# Patient Record
Sex: Male | Born: 1990 | Race: White | Hispanic: No | Marital: Married | Smoking: Former smoker
Health system: Southern US, Community
[De-identification: ages and names within clinical notes are randomized; demographics above are authoritative.]

## PROBLEM LIST (undated history)

## (undated) DIAGNOSIS — T7840XA Allergy, unspecified, initial encounter: Secondary | ICD-10-CM

## (undated) DIAGNOSIS — F419 Anxiety disorder, unspecified: Secondary | ICD-10-CM

## (undated) DIAGNOSIS — J45909 Unspecified asthma, uncomplicated: Secondary | ICD-10-CM

## (undated) DIAGNOSIS — F32A Depression, unspecified: Secondary | ICD-10-CM

## (undated) DIAGNOSIS — F329 Major depressive disorder, single episode, unspecified: Secondary | ICD-10-CM

## (undated) HISTORY — DX: Anxiety disorder, unspecified: F41.9

## (undated) HISTORY — DX: Major depressive disorder, single episode, unspecified: F32.9

## (undated) HISTORY — DX: Depression, unspecified: F32.A

## (undated) HISTORY — DX: Unspecified asthma, uncomplicated: J45.909

## (undated) HISTORY — DX: Allergy, unspecified, initial encounter: T78.40XA

---
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
## 2010-12-10 NOTE — Op Note (Signed)
[facility]  Patient:    Michael Townsend, Michael Townsend                      MRN: [id number] Proc. Date: 01/24/00 Adm. Date:  [id number] Attending:  Devoria Albe CC:         Dario Guardian, M.D.                           Operative Report  PROCEDURE:  Left scrotal exploration and excision of torsed appendix testis.  PREOPERATIVE DIAGNOSIS:  Left acute scrotum.  POSTOPERATIVE DIAGNOSIS:  Left torsed appendix testis.  SURGEON:  Bjorn Pippin, M.D.  ANESTHESIA:  General.  COMPLICATIONS:  None.  SPECIMEN:  Torsed appendix testis.  INDICATION FOR PROCEDURE:  [name] is a 52-year-old white male with acute onset at midday today of left testicular pain. 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 After discussing the situation with the parents, we elected to proceed with scrotal exploration to rule out torsion of the testicle versus torsed appendix testis.  DESCRIPTION OF PROCEDURE:  The patient was taken to the operating room where a general anesthetic was induced. He was placed in the supine position. His genitalia was prepped with Betadine solution and he was draped in the usual sterile fashion. A left scrotal incision was made obliquely along the lines of the scrotal rugae. The testicle within the appendix was delivered from the wound. The tunica vaginalis was opened and the testicle was delivered. At the upper pole of the testicle, there was a small appendix testis which was somewhat edematous and erythematous consistent with an intermittently torsing appendix testis. After identifying this lesion, it was removed with the needle point Bovie. Inspection of the testicle revealed no other abnormalities. The testicle was returned to the tunica vaginalis which was then closed with a running 4-0 chromic. The dartos muscle was closed with a running 4-0 chromic and the skin was closed with interrupted 4-0 chromic. Collodion was applied  to the wound and a dressing of 4 x 4s and a scrotal support was applied. The patients anesthetic was reversed and he was admitted to the recovery room in stable condition. There were no complications. DD:  01/24/00 TD:  01/25/00 Job: [id number]

## 2015-04-12 ENCOUNTER — Ambulatory Visit (INDEPENDENT_AMBULATORY_CARE_PROVIDER_SITE_OTHER): Payer: Managed Care, Other (non HMO) | Admitting: Emergency Medicine

## 2015-04-12 VITALS — BP 128/70 | HR 95 | Temp 98.7°F | Resp 18 | Ht 67.5 in | Wt 245.0 lb

## 2015-04-12 DIAGNOSIS — J0140 Acute pansinusitis, unspecified: Secondary | ICD-10-CM

## 2015-04-12 DIAGNOSIS — J209 Acute bronchitis, unspecified: Secondary | ICD-10-CM | POA: Diagnosis not present

## 2015-04-12 MED ORDER — PSEUDOEPHEDRINE-GUAIFENESIN ER 60-600 MG PO TB12: 1.0000 | ORAL_TABLET | Freq: Two times a day (BID) | ORAL | Status: AC

## 2015-04-12 MED ORDER — HYDROCOD POLST-CPM POLST ER 10-8 MG/5ML PO SUER: 5.0000 mL | Freq: Two times a day (BID) | ORAL | Status: AC

## 2015-04-12 MED ORDER — AMOXICILLIN-POT CLAVULANATE 875-125 MG PO TABS: 1.0000 | ORAL_TABLET | Freq: Two times a day (BID) | ORAL | Status: AC

## 2015-04-12 NOTE — Progress Notes (Signed)
Subjective:  Patient ID: Michael Townsend, male    DOB: 1991-03-23  Age: 24 y.o. MRN: [id number]  CC: Cough; Sinus Problem; and Diarrhea   HPI Michael Townsend presents  with nasal congestion postnasal drainage nasal discharge all purulent character. She has a sore throat. Pressure in his cheeks. Denies any fever chills. No nausea or vomiting. Has a cough productive of purulent sputum and hasn't slept for 2 nights because of cough. Had no wheezing or shortness of breath. Has had some loose stool over the last 2 days which is improved.  History Michael Townsend has a past medical history of Allergy; Anxiety; Asthma; and Depression.   He has no past surgical history on file.   His  family history includes Diabetes in his maternal grandfather, maternal grandmother, paternal grandfather, and paternal grandmother; Heart disease in his maternal grandfather, maternal grandmother, paternal grandfather, and paternal grandmother; Hyperlipidemia in his father, maternal grandfather, maternal grandmother, paternal grandfather, and paternal grandmother.  He   reports that he has quit smoking. His smoking use included Cigarettes. He has a 2 pack-year smoking history. His smokeless tobacco use includes Snuff. He reports that he drinks about 1.8 oz of alcohol per week. He reports that he does not use illicit drugs.  No outpatient prescriptions prior to visit.   No facility-administered medications prior to visit.    Social History   Social History  . Marital Status: Married    Spouse Name: N/A  . Number of Children: N/A  . Years of Education: N/A   Social History Main Topics  . Smoking status: Former Smoker -- 1.00 packs/day for 2 years    Types: Cigarettes  . Smokeless tobacco: Current User    Types: Snuff     Comment: Quit in September 2016  . Alcohol Use: 1.8 oz/week    3 Standard drinks or equivalent per week  . 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 Sexual Activity: Not Asked   Other  Topics Concern  . None   Social History Narrative  . None     Review of Systems  Constitutional: Negative for fever, chills and appetite change.  HENT: Positive for congestion, postnasal drip, rhinorrhea, sinus pressure and sore throat. Negative for ear pain.   Eyes: Negative for pain and redness.  Respiratory: Positive for cough. Negative for shortness of breath and wheezing.   Cardiovascular: Negative for leg swelling.  Gastrointestinal: Negative for nausea, vomiting, abdominal pain, diarrhea, constipation and blood in stool.  Endocrine: Negative for polyuria.  Genitourinary: Negative for dysuria, urgency, frequency and flank pain.  Musculoskeletal: Negative for gait problem.  Skin: Negative for rash.  Neurological: Negative for weakness and headaches.  Psychiatric/Behavioral: Negative for confusion and decreased concentration. The patient is not nervous/anxious.     Objective:  BP 128/70 mmHg  Pulse 95  Temp(Src) 98.7 F (37.1 C) (Oral)  Resp 18  Ht 5' 7.5" (1.715 m)  Wt 245 lb (111.131 kg)  BMI 37.78 kg/m2  SpO2 98%  Physical Exam  Constitutional: He is oriented to person, place, and time. He appears well-developed and well-nourished. No distress.  HENT:  Head: Normocephalic and atraumatic.  Right Ear: External ear normal.  Left Ear: External ear normal.  Nose: Nose normal.  Eyes: Conjunctivae and EOM are normal. Pupils are equal, round, and reactive to light. No scleral icterus.  Neck: Normal range of motion. Neck supple. No tracheal deviation present.  Cardiovascular: Normal rate, regular rhythm and normal  heart sounds.   Pulmonary/Chest: Effort normal. No respiratory distress. He has no wheezes. He has no rales.  Abdominal: He exhibits no mass. There is no tenderness. There is no rebound and no guarding.  Musculoskeletal: He exhibits no edema.  Lymphadenopathy:    He has no cervical adenopathy.  Neurological: He is alert and oriented to person, place, and time.  Coordination normal.  Skin: Skin is warm and dry. No rash noted.  Psychiatric: He has a normal mood and affect. His behavior is normal.      Assessment & Plan:   Michael Townsend was seen today for cough, sinus problem and diarrhea.  Diagnoses and all orders for this visit:  Acute bronchitis, unspecified organism  Acute pansinusitis, recurrence not specified  Other orders -     amoxicillin-clavulanate (AUGMENTIN) 875-125 MG per tablet; Take 1 tablet by mouth 2 (two) times daily. -     pseudoephedrine-guaifenesin (MUCINEX D) 60-600 MG per tablet; Take 1 tablet by mouth every 12 (twelve) hours. -     chlorpheniramine-HYDROcodone (TUSSIONEX PENNKINETIC ER) 10-8 MG/5ML SUER; Take 5 mLs by mouth 2 (two) times daily.  I am having Michael Townsend start on amoxicillin-clavulanate, pseudoephedrine-guaifenesin, and chlorpheniramine-HYDROcodone.  Meds ordered this encounter  Medications  . amoxicillin-clavulanate (AUGMENTIN) 875-125 MG per tablet    Sig: Take 1 tablet by mouth 2 (two) times daily.    Dispense:  20 tablet    Refill:  0  . pseudoephedrine-guaifenesin (MUCINEX D) 60-600 MG per tablet    Sig: Take 1 tablet by mouth every 12 (twelve) hours.    Dispense:  18 tablet    Refill:  0  . chlorpheniramine-HYDROcodone (TUSSIONEX PENNKINETIC ER) 10-8 MG/5ML SUER    Sig: Take 5 mLs by mouth 2 (two) times daily.    Dispense:  60 mL    Refill:  0    Appropriate red flag conditions were discussed with the patient as well as actions that should be taken.  Patient expressed his understanding.  Follow-up: Return if symptoms worsen or fail to improve.  Carmelina Dane, MD

## 2015-04-12 NOTE — Patient Instructions (Signed)

## 2022-01-22 ENCOUNTER — Encounter (HOSPITAL_COMMUNITY): Payer: Self-pay | Admitting: Emergency Medicine

## 2022-01-22 ENCOUNTER — Other Ambulatory Visit: Payer: Self-pay

## 2022-01-22 ENCOUNTER — Emergency Department (HOSPITAL_COMMUNITY)
Admission: EM | Admit: 2022-01-22 | Discharge: 2022-01-22 | Disposition: A | Discharge status: Home / Self Care | Payer: Self-pay | Attending: Emergency Medicine | Admitting: Emergency Medicine

## 2022-01-22 DIAGNOSIS — L500 Allergic urticaria: Secondary | ICD-10-CM | POA: Insufficient documentation

## 2022-01-22 DIAGNOSIS — T7840XA Allergy, unspecified, initial encounter: Secondary | ICD-10-CM

## 2022-01-22 MED ORDER — METHYLPREDNISOLONE SODIUM SUCC 125 MG IJ SOLR
125.0000 mg | Freq: Once | INTRAMUSCULAR | Status: AC
  Administered 2022-01-22: 125 mg via INTRAVENOUS
  Filled 2022-01-22: qty 2

## 2022-01-22 MED ORDER — FAMOTIDINE IN NACL 20-0.9 MG/50ML-% IV SOLN
20.0000 mg | INTRAVENOUS | Status: AC
  Administered 2022-01-22: 20 mg via INTRAVENOUS
  Filled 2022-01-22: qty 50

## 2022-01-22 MED ORDER — PREDNISONE 20 MG PO TABS: ORAL_TABLET | ORAL | 0 refills | Status: AC

## 2022-01-22 MED ORDER — EPINEPHRINE 0.3 MG/0.3ML IJ SOAJ
0.3000 mg | Freq: Once | INTRAMUSCULAR | Status: AC
  Administered 2022-01-22: 0.3 mg via INTRAMUSCULAR
  Filled 2022-01-22: qty 0.3

## 2022-01-22 MED ORDER — DIPHENHYDRAMINE HCL 50 MG/ML IJ SOLN
25.0000 mg | Freq: Once | INTRAMUSCULAR | Status: AC
  Administered 2022-01-22: 25 mg via INTRAVENOUS
  Filled 2022-01-22: qty 1

## 2022-01-22 MED ORDER — SODIUM CHLORIDE 0.9 % IV BOLUS
1000.0000 mL | Freq: Once | INTRAVENOUS | Status: AC
  Administered 2022-01-22: 1000 mL via INTRAVENOUS

## 2022-01-22 NOTE — ED Triage Notes (Signed)
Pt reported to ED for evaluation of allergic reaction from possible bug bite to posterior neck, left abdomen and right forearm. States onset of symptoms began approximately 30 minutes ago and is noted to be covered in hives. Denies any difficulty breathing, shortness of breath, throat itching or excessive salivation at this time.

## 2022-01-22 NOTE — Discharge Instructions (Signed)
Take the prescribed medication as directed. °Follow-up with your primary care doctor. °Return to the ED for new or worsening symptoms. °

## 2022-01-22 NOTE — ED Provider Notes (Signed)
[facility] EMERGENCY DEPARTMENT Provider Note   CSN: [id number] Arrival date & time: 01/22/22  0865     History  Chief Complaint  Patient presents with   Allergic Reaction    Michael Townsend is a 31 y.o. male.  The history is provided by the patient and medical records.  Allergic Reaction Presenting symptoms: rash    31 year old male presenting to the ED with allergic reaction.  He thinks he was bit by a spider and an ant and now has diffuse urticarial rash throughout his entire body.  Rash is incredibly itchy.  He did take 2 Benadryl prior to arrival without any change.  He denies any lip or tongue swelling, no sensation of throat closing or swelling.  He is not having any difficulty swallowing or shortness of breath.  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  No known medication allergies.  Home Medications Prior to Admission medications   Medication Sig Start Date End Date Taking? Authorizing Provider  amoxicillin-clavulanate (AUGMENTIN) 875-125 MG per tablet Take 1 tablet by mouth 2 (two) times daily. 04/12/15   Carmelina Dane, MD  chlorpheniramine-HYDROcodone [facility] PENNKINETIC ER) 10-8 MG/5ML SUER Take 5 mLs by mouth 2 (two) times daily. 04/12/15   Carmelina Dane, MD      Allergies    Bee venom    Review of Systems   Review of Systems  Skin:  Positive for rash.  All other systems reviewed and are negative.   Physical Exam Updated Vital Signs BP 130/79   Pulse (!) 116   Temp 97.8 F (36.6 C) (Oral)   Resp 18   SpO2 97%   Physical Exam Vitals and nursing note reviewed.  Constitutional:      Appearance: He is well-developed.  HENT:     Head: Normocephalic and atraumatic.     Mouth/Throat:     Comments: No lip/tongue swelling, handling secretions well, no stridor Eyes:     Conjunctiva/sclera: Conjunctivae normal.     Pupils: Pupils are equal, round, and reactive to light.  Cardiovascular:     Rate and Rhythm: Normal rate and  regular rhythm.     Heart sounds: Normal heart sounds.  Pulmonary:     Effort: Pulmonary effort is normal. No respiratory distress.     Breath sounds: Normal breath sounds. No rhonchi.  Abdominal:     General: Bowel sounds are normal.     Palpations: Abdomen is soft.     Tenderness: There is no abdominal tenderness. There is no rebound.  Musculoskeletal:        General: Normal range of motion.     Cervical back: Normal range of motion.  Skin:    General: Skin is warm and dry.     Findings: Rash present. Rash is urticarial.     Comments: Diffuse urticarial rash throughout BUE, BLE, torso, rash is sparing palms/soles  Neurological:     Mental Status: He is alert and oriented to person, place, and time.     ED Results / Procedures / Treatments   Labs (all labs ordered are listed, but only abnormal results are displayed) Labs Reviewed - No data to display  EKG EKG Interpretation  Date/Time:  Saturday January 22 2022 06:23:49 EDT Ventricular Rate:  92 PR Interval:  144 QRS Duration: 104 QT Interval:  351 QTC Calculation: 435 R Axis:   62 Text Interpretation: Sinus rhythm Low voltage, precordial leads Confirmed by Palumbo, April ([id number]) on 01/22/2022 6:25:36 AM  Radiology No results found.  Procedures Procedures    CRITICAL CARE Performed by: Garlon Hatchet   Total critical care time: 40 minutes  Critical care time was exclusive of separately billable procedures and treating other patients.  Critical care was necessary to treat or prevent imminent or life-threatening deterioration.  Critical care was time spent personally by me on the following activities: development of treatment plan with patient and/or surrogate as well as nursing, discussions with consultants, evaluation of patient's response to treatment, examination of patient, obtaining history from patient or surrogate, ordering and performing treatments and interventions, ordering and review of laboratory studies,  ordering and review of radiographic studies, pulse oximetry and re-evaluation of patient's condition.    Medications Ordered in ED Medications  diphenhydrAMINE (BENADRYL) injection 25 mg (25 mg Intravenous Given 01/22/22 0549)  famotidine (PEPCID) IVPB 20 mg premix (0 mg Intravenous Stopped 01/22/22 0620)  methylPREDNISolone sodium succinate (SOLU-MEDROL) 125 mg/2 mL injection 125 mg (125 mg Intravenous Given 01/22/22 0548)  sodium chloride 0.9 % bolus 1,000 mL (1,000 mLs Intravenous New Bag/Given 01/22/22 0548)  EPINEPHrine (EPI-PEN) injection 0.3 mg (0.3 mg Intramuscular Given 01/22/22 2703)    ED Course/ Medical Decision Making/ A&P                           Medical Decision Making Amount and/or Complexity of Data Reviewed ECG/medicine tests: ordered.  Risk Prescription drug management.   31 year old M presenting to the ED with shortness of breath allergic reaction after being bit by a spider and ant.  He has diffuse urticarial rash across the body, sparing palms/soles.  No lip/tongue swelling, handling secretions well, no stridor.  Does not appear to have true anaphylaxis at this time.  Will treat with Benadryl, Pepcid, Solu-Medrol, IV fluids.  6:13 AM Now starting to complain of chest tightness and has developed a cough.  VS remain stable on RA.  Will go ahead with epi-pen.  Will obtain EKG.  Care will be signed out to oncoming provider.  If symptoms improving and remains without airway compromise, should be stable for discharge with PCP follow-up.  Final Clinical Impression(s) / ED Diagnoses Final diagnoses:  Allergic reaction, initial encounter    Rx / DC Orders ED Discharge Orders          Ordered    predniSONE (DELTASONE) 20 MG tablet        01/22/22 0623              Garlon Hatchet, PA-C 01/22/22 5009    Palumbo, April, MD 01/22/22 [phone]

## 2022-01-22 NOTE — ED Provider Notes (Signed)
31 yo male with allergic reaction to ant and spider bite. Given epi, monitor for a few hours and dc if stable.  Physical Exam  BP 123/76 (BP Location: Left Arm)   Pulse 73   Temp 97.8 F (36.6 C) (Oral)   Resp 20   SpO2 99%   Physical Exam Pulmonary:     Effort: Pulmonary effort is normal.  Skin:    General: Skin is warm and dry.     Findings: No erythema or rash.     Procedures  Procedures  ED Course / MDM    Medical Decision Making Amount and/or Complexity of Data Reviewed ECG/medicine tests: ordered.  Risk Prescription drug management.   Patient resting comfortably, arouses to verbal stimuli and subsequently awake.  States that he is feeling much better, no breathing difficulty, rash resolved.  Plan is to follow-up with PCP, return to ED as needed, given prescription for prednisone.       Jeannie Fend, PA-C 01/22/22 [id number]    Palumbo, April, MD 01/26/22 [id number]

## 2022-01-22 NOTE — ED Notes (Signed)
Walked patient to the bathroom patient did well patient is now back in bed on the monitor with call bell in reach
